# Patient Record
Sex: Female | Born: 1955 | Race: White | Hispanic: No | Smoking: Former smoker
Health system: Southern US, Community
[De-identification: ages and names within clinical notes are randomized; demographics above are authoritative.]

## PROBLEM LIST (undated history)

## (undated) DIAGNOSIS — D649 Anemia, unspecified: Secondary | ICD-10-CM

## (undated) DIAGNOSIS — K635 Polyp of colon: Secondary | ICD-10-CM

## (undated) DIAGNOSIS — D219 Benign neoplasm of connective and other soft tissue, unspecified: Secondary | ICD-10-CM

## (undated) DIAGNOSIS — N83209 Unspecified ovarian cyst, unspecified side: Secondary | ICD-10-CM

## (undated) DIAGNOSIS — K5790 Diverticulosis of intestine, part unspecified, without perforation or abscess without bleeding: Secondary | ICD-10-CM

## (undated) DIAGNOSIS — F419 Anxiety disorder, unspecified: Secondary | ICD-10-CM

## (undated) DIAGNOSIS — E785 Hyperlipidemia, unspecified: Secondary | ICD-10-CM

## (undated) DIAGNOSIS — J40 Bronchitis, not specified as acute or chronic: Secondary | ICD-10-CM

## (undated) DIAGNOSIS — K219 Gastro-esophageal reflux disease without esophagitis: Secondary | ICD-10-CM

## (undated) DIAGNOSIS — G4700 Insomnia, unspecified: Secondary | ICD-10-CM

## (undated) DIAGNOSIS — G479 Sleep disorder, unspecified: Secondary | ICD-10-CM

## (undated) DIAGNOSIS — T7840XA Allergy, unspecified, initial encounter: Secondary | ICD-10-CM

## (undated) HISTORY — DX: Allergy, unspecified, initial encounter: T78.40XA

## (undated) HISTORY — PX: WISDOM TOOTH EXTRACTION: SHX21

## (undated) HISTORY — DX: Anemia, unspecified: D64.9

## (undated) HISTORY — DX: Diverticulosis of intestine, part unspecified, without perforation or abscess without bleeding: K57.90

## (undated) HISTORY — DX: Sleep disorder, unspecified: G47.9

## (undated) HISTORY — PX: UPPER GASTROINTESTINAL ENDOSCOPY: SHX188

## (undated) HISTORY — DX: Unspecified ovarian cyst, unspecified side: N83.209

## (undated) HISTORY — DX: Insomnia, unspecified: G47.00

## (undated) HISTORY — DX: Hyperlipidemia, unspecified: E78.5

## (undated) HISTORY — DX: Benign neoplasm of connective and other soft tissue, unspecified: D21.9

## (undated) HISTORY — DX: Anxiety disorder, unspecified: F41.9

## (undated) HISTORY — DX: Polyp of colon: K63.5

## (undated) HISTORY — DX: Bronchitis, not specified as acute or chronic: J40

## (undated) HISTORY — PX: POLYPECTOMY: SHX149

## (undated) HISTORY — PX: COLONOSCOPY: SHX174

## (undated) HISTORY — DX: Gastro-esophageal reflux disease without esophagitis: K21.9

---
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
## 2016-09-26 ENCOUNTER — Emergency Department (HOSPITAL_COMMUNITY)
Admission: EM | Admit: 2016-09-26 | Discharge: 2016-09-26 | Disposition: A | Discharge status: Home / Self Care | Payer: 59 | Attending: Emergency Medicine | Admitting: Emergency Medicine

## 2016-09-26 ENCOUNTER — Encounter (HOSPITAL_COMMUNITY): Payer: Self-pay | Admitting: Emergency Medicine

## 2016-09-26 DIAGNOSIS — Y9389 Activity, other specified: Secondary | ICD-10-CM | POA: Diagnosis not present

## 2016-09-26 DIAGNOSIS — W268XXA Contact with other sharp object(s), not elsewhere classified, initial encounter: Secondary | ICD-10-CM | POA: Diagnosis not present

## 2016-09-26 DIAGNOSIS — Y999 Unspecified external cause status: Secondary | ICD-10-CM | POA: Insufficient documentation

## 2016-09-26 DIAGNOSIS — Y929 Unspecified place or not applicable: Secondary | ICD-10-CM | POA: Diagnosis not present

## 2016-09-26 DIAGNOSIS — S61211A Laceration without foreign body of left index finger without damage to nail, initial encounter: Secondary | ICD-10-CM | POA: Diagnosis present

## 2016-09-26 MED ORDER — LIDOCAINE HCL 2 % IJ SOLN
10.0000 mL | Freq: Once | INTRAMUSCULAR | Status: AC
  Administered 2016-09-26: 200 mg
  Filled 2016-09-26: qty 20

## 2016-09-26 NOTE — ED Triage Notes (Signed)
Patient was crafting and cut her left index finger.

## 2016-09-26 NOTE — ED Provider Notes (Signed)
[facility] DEPT Provider Note   CSN: [id number] Arrival date & time: 09/26/16  0211     History   Chief Complaint Chief Complaint  Patient presents with  . Finger Injury    HPI Taylor Duffy is a 61 y.o. female with No major medical history presents to the Emergency Department complaining of acute, persistent laceration to the left index finger occurring approximately 30 minutes prior to arrival. Patient reports she was using round cutter to cut fabric when it slipped cutting her finger. She reports her tetanus is up-to-date within the last month. No aggravating or alleviating factors. She reports profuse bleeding initially which is now controlled. Pain is mild.  The history is provided by the patient and medical records. No language interpreter was used.    History reviewed. No pertinent past medical history.  There are no active problems to display for this patient.   History reviewed. No pertinent surgical history.  OB History    No data available       Home Medications    Prior to Admission medications   Not on File    Family History History reviewed. No pertinent family history.  Social History Social History  Substance Use Topics  . Smoking status: Never Smoker  . Smokeless tobacco: Never Used  . Alcohol use No     Allergies   Ivp dye [iodinated diagnostic agents]   Review of Systems Review of Systems  Skin: Positive for wound.  All other systems reviewed and are negative.    Physical Exam Updated Vital Signs BP 121/90 (BP Location: Left Arm)   Pulse 103   Temp 97.7 F (36.5 C) (Oral)   Resp 20   SpO2 99%   Physical Exam  Constitutional: She is oriented to person, place, and time. She appears well-developed and well-nourished. No distress.  HENT:  Head: Normocephalic and atraumatic.  Eyes: Conjunctivae are normal. No scleral icterus.  Neck: Normal range of motion.  Cardiovascular: Normal rate, regular rhythm, normal heart sounds and  intact distal pulses.   No murmur heard. Capillary refill < 3 sec  Pulmonary/Chest: Effort normal and breath sounds normal. No respiratory distress.  Musculoskeletal: Normal range of motion. She exhibits no edema.  ROM: Full range of motion of the left index finger 3 cm laceration to the lateral side over the PIP joint, hemostasis achieved  Neurological: She is alert and oriented to person, place, and time.  Sensation: Intact to dull and sharp throughout the left first finger Strength: Right/5 with flexion extension of the left first finger  Skin: Skin is warm and dry. She is not diaphoretic.  Psychiatric: She has a normal mood and affect.  Nursing note and vitals reviewed.    ED Treatments / Results   Procedures .Marland KitchenLaceration Repair Date/Time: 09/26/2016 3:10 AM Performed by: Abigail Butts Authorized by: Abigail Butts   Consent:    Consent obtained:  Verbal   Consent given by:  Patient   Risks discussed:  Infection, pain, poor cosmetic result, poor wound healing and need for additional repair   Alternatives discussed:  No treatment and observation Anesthesia (see MAR for exact dosages):    Anesthesia method:  Nerve block   Block needle gauge:  25 G   Block anesthetic:  Lidocaine 2% w/o epi (5 mL) Laceration details:    Location:  Finger   Finger location:  L index finger   Length (cm):  3 Repair type:    Repair type:  Simple Pre-procedure details:  Preparation:  Patient was prepped and draped in usual sterile fashion Exploration:    Hemostasis achieved with:  Direct pressure   Wound exploration: wound explored through full range of motion and entire depth of wound probed and visualized     Contaminated: no   Treatment:    Area cleansed with:  Saline   Amount of cleaning:  Standard   Irrigation solution:  Sterile water   Irrigation volume:  243ml   Irrigation method:  Syringe Skin repair:    Repair method:  Sutures   Suture size:  5-0   Suture  material:  Prolene   Suture technique:  Simple interrupted   Number of sutures:  3 Approximation:    Approximation:  Close   Vermilion border: well-aligned   Post-procedure details:    Dressing:  Open (no dressing)   Patient tolerance of procedure:  Tolerated well, no immediate complications    (including critical care time)  Medications Ordered in ED Medications  lidocaine (XYLOCAINE) 2 % (with pres) injection 200 mg (200 mg Infiltration Given 09/26/16 0308)     Initial Impression / Assessment and Plan / ED Course  I have reviewed the triage vital signs and the nursing notes.  Pertinent labs & imaging results that were available during my care of the patient were reviewed by me and considered in my medical decision making (see chart for details).     Pressure irrigation performed. Wound explored and base of wound visualized in a bloodless field without evidence of foreign body.  Laceration occurred < 8 hours prior to repair which was well tolerated.  Tdap up to date.  Pt has no comorbidities to effect normal wound healing. Pt discharged without antibiotics.  Discussed suture home care with patient and answered questions. Pt to follow-up for wound check and suture removal in 7 days; they are to return to the ED sooner for signs of infection. Pt is hemodynamically stable with no complaints prior to dc.   Final Clinical Impressions(s) / ED Diagnoses   Final diagnoses:  Laceration of left index finger without foreign body without damage to nail, initial encounter    New Prescriptions New Prescriptions   No medications on file     Abigail Butts, PA-C [id number]    Delora Fuel, MD [id number]

## 2016-09-26 NOTE — ED Notes (Signed)
ED Provider at bedside for suturing.  

## 2016-09-26 NOTE — Discharge Instructions (Signed)

## 2017-10-18 ENCOUNTER — Telehealth: Payer: Self-pay | Admitting: Gastroenterology

## 2017-10-18 ENCOUNTER — Encounter: Payer: Self-pay | Admitting: Gastroenterology

## 2017-10-18 NOTE — Telephone Encounter (Signed)
A user error has taken place.

## 2017-11-30 ENCOUNTER — Encounter: Payer: Self-pay | Admitting: Gastroenterology

## 2017-11-30 ENCOUNTER — Encounter (INDEPENDENT_AMBULATORY_CARE_PROVIDER_SITE_OTHER): Payer: Self-pay

## 2017-11-30 ENCOUNTER — Ambulatory Visit: Payer: 59 | Admitting: Gastroenterology

## 2017-11-30 VITALS — BP 110/70 | HR 96 | Ht 63.25 in | Wt 155.6 lb

## 2017-11-30 DIAGNOSIS — R1031 Right lower quadrant pain: Secondary | ICD-10-CM

## 2017-11-30 DIAGNOSIS — Z86010 Personal history of colonic polyps: Secondary | ICD-10-CM | POA: Diagnosis not present

## 2017-11-30 DIAGNOSIS — R1011 Right upper quadrant pain: Secondary | ICD-10-CM

## 2017-11-30 DIAGNOSIS — Z800 Family history of malignant neoplasm of digestive organs: Secondary | ICD-10-CM | POA: Diagnosis not present

## 2017-11-30 MED ORDER — NA SULFATE-K SULFATE-MG SULF 17.5-3.13-1.6 GM/177ML PO SOLN: ORAL | 0 refills | Status: DC

## 2017-11-30 MED ORDER — NA SULFATE-K SULFATE-MG SULF 17.5-3.13-1.6 GM/177ML PO SOLN: 1.0000 | Freq: Once | ORAL | 0 refills | Status: AC

## 2017-11-30 NOTE — Progress Notes (Signed)
Taylor Duffy    [id number]    17-Dec-1955  Primary Care Physician:Gagliardi, Anthoney Harada, MD  Referring Physician: No referring provider defined for this encounter.  Chief complaint: Right-sided abdominal pain  HPI: 62 year old female here for new patient visit with complaints of right-sided abdominal pain. He has had right upper quadrant abdominal pain on and off for the past 2 years, feels it is progressively getting worse and in the past 2-3 months she started having constant right lower quadrant abdominal pain She was diagnosed with irritable bowel with diarrhea and has episodes of increased bowel frequency and loose stool associated with abdominal cramps about once a week She has noticed exacerbation of symptoms when she consumes milk products like ice cream Colon cancer in Father in his 65's had surgery to resect Grandfather also had cancer, unsure what and maternal uncle had lung cancer No loss of appetite or weight loss Denies any nausea, vomiting, abdominal pain, melena or bright red blood per rectum  Personal history of colon polyps.  Colonoscopy report not available during this visit  She had EGD, Colonoscopies X 3 in the past patient with removal of small polyps  starting at age 84, most recent exam about 14-15 years per patient.    Outpatient Encounter Medications as of 11/30/2017  Medication Sig  . buPROPion (WELLBUTRIN XL) 300 MG 24 hr tablet Take 300 mg by mouth daily.  . Cholecalciferol 1000 units TBDP Take by mouth.  . DULoxetine (CYMBALTA) 30 MG capsule Take 30 mg by mouth daily.  . Eszopiclone 3 MG TABS Take 3 mg by mouth at bedtime. Take immediately before bedtime  . ezetimibe (ZETIA) 10 MG tablet Take 10 mg by mouth daily.  . fluticasone (FLOVENT HFA) 110 MCG/ACT inhaler Inhale into the lungs 2 (two) times daily.  Marland Kitchen rOPINIRole (REQUIP) 0.25 MG tablet Take 0.25 mg by mouth 3 (three) times daily. Take 3 tablets by mouth daily   No  facility-administered encounter medications on file as of 11/30/2017.     Allergies as of 11/30/2017 - Review Complete 11/30/2017  Allergen Reaction Noted  . Ivp dye [iodinated diagnostic agents]  09/26/2016    Past Medical History:  Diagnosis Date  . Anxiety   . Bronchitis   . Colon polyps   . Diverticulosis   . Fibroid    small fundal   . HLD (hyperlipidemia)   . Ovarian cyst   . Restless sleeper   . Sleep difficulties     Past Surgical History:  Procedure Laterality Date  . CESAREAN SECTION    . NASAL SEPTUM SURGERY  1974  . WISDOM TOOTH EXTRACTION      Family History  Problem Relation Age of Onset  . Colon cancer Father 32  . Heart disease Father   . Heart disease Mother     Social History   Socioeconomic History  . Marital status: Single    Spouse name: Not on file  . Number of children: 1  . Years of education: Not on file  . Highest education level: Not on file  Occupational History  . Occupation: retired  Scientific laboratory technician  . Financial resource strain: Not on file  . Food insecurity:    Worry: Not on file    Inability: Not on file  . Transportation needs:    Medical: Not on file    Non-medical: Not on file  Tobacco Use  . Smoking status: Former Smoker    Last  attempt to quit: 1980    Years since quitting: 39.2  . Smokeless tobacco: Never Used  Substance and Sexual Activity  . Alcohol use: Yes    Comment: 2-3 per day  . Drug use: No  . Sexual activity: Not on file  Lifestyle  . Physical activity:    Days per week: Not on file    Minutes per session: Not on file  . Stress: Not on file  Relationships  . Social connections:    Talks on phone: Not on file    Gets together: Not on file    Attends religious service: Not on file    Active member of club or organization: Not on file    Attends meetings of clubs or organizations: Not on file    Relationship status: Not on file  . Intimate partner violence:    Fear of current or ex partner: Not on  file    Emotionally abused: Not on file    Physically abused: Not on file    Forced sexual activity: Not on file  Other Topics Concern  . Not on file  Social History Narrative  . Not on file      Review of systems: Review of Systems  Constitutional: Negative for fever and chills. Positive for fatigue HENT: Negative.   Eyes: Negative for blurred vision.  Respiratory: Negative for cough, shortness of breath and wheezing.  Postive for cough Cardiovascular: Negative for chest pain and palpitations.  Gastrointestinal: as per HPI Genitourinary: Negative for dysuria, urgency, frequency and hematuria.  Musculoskeletal: Negative for myalgias, back pain and joint pain.  Skin: Negative for itching and rash.  Neurological: Negative for dizziness, tremors, focal weakness, seizures and loss of consciousness.  Endo/Heme/Allergies: Positive for seasonal allergies.  Psychiatric/Behavioral: Negative for depression, suicidal ideas and hallucinations.  All other systems reviewed and are negative.   Physical Exam: Vitals:   11/30/17 0856  BP: 110/70  Pulse: 96   Body mass index is 27.35 kg/m. Gen:      No acute distress HEENT:  EOMI, sclera anicteric Neck:     No masses; no thyromegaly Lungs:    Clear to auscultation bilaterally; normal respiratory effort CV:         Regular rate and rhythm; no murmurs Abd:      + bowel sounds; soft, non-tender; no palpable masses, no distension Ext:    No edema; adequate peripheral perfusion Skin:      Warm and dry; no rash Neuro: alert and oriented x 3 Psych: normal mood and affect  Data Reviewed:  Reviewed labs, radiology imaging, old records and pertinent past GI work up  Pelvic Ultrasound 09/29/16 Small left ovarian cyst and small fundal fibroid  XR chest  Persistent lower lobe bronchial wall thickening, mild bronchiectasis vs bronchitis vs partial atelectasis  B12 level: 154  Assessment and Plan/Recommendations: 62 year old female here  with complaints of right upper quadrant abdominal pain and constant right lower quadrant abdominal pain.  She has family history of colon cancer and also personal history of colon polyps We will schedule for abdominal ultrasound to exclude gallbladder disease Patient is past due for screening colonoscopy, will proceed with it Will obtain records of her prior endoscopy and colonoscopy from previous GI The risks and benefits as well as alternatives of endoscopic procedure(s) have been discussed and reviewed. All questions answered. The patient agrees to proceed.  Greater than 50% of the time used for counseling as well as treatment plan and follow-up. She had  multiple questions which were answered to her satisfaction  K. Denzil Magnuson , MD [phone]    CC: No ref. provider found

## 2017-11-30 NOTE — Patient Instructions (Signed)
You have been scheduled for an abdominal ultrasound at [facility] (1st floor of hospital) on 12/03/2017 at 10:30am. Please arrive 15 minutes prior to your appointment for registration. Make certain not to have anything to eat or drink 6 hours prior to your appointment. Should you need to reschedule your appointment, please contact radiology at [phone]. This test typically takes about 30 minutes to perform.  You have been scheduled for a colonoscopy. Please follow written instructions given to you at your visit today.  Please pick up your prep supplies at the pharmacy within the next 1-3 days. If you use inhalers (even only as needed), please bring them with you on the day of your procedure.

## 2017-12-01 ENCOUNTER — Encounter: Payer: Self-pay | Admitting: Gastroenterology

## 2017-12-03 ENCOUNTER — Ambulatory Visit (HOSPITAL_COMMUNITY)
Admission: RE | Admit: 2017-12-03 | Discharge: 2017-12-03 | Disposition: A | Discharge status: Home / Self Care | Payer: 59 | Source: Ambulatory Visit | Attending: Gastroenterology | Admitting: Gastroenterology

## 2017-12-03 ENCOUNTER — Encounter: Payer: Self-pay | Admitting: Gastroenterology

## 2017-12-03 DIAGNOSIS — Z800 Family history of malignant neoplasm of digestive organs: Secondary | ICD-10-CM | POA: Diagnosis not present

## 2017-12-03 DIAGNOSIS — R1031 Right lower quadrant pain: Secondary | ICD-10-CM | POA: Diagnosis not present

## 2017-12-03 DIAGNOSIS — Z86010 Personal history of colonic polyps: Secondary | ICD-10-CM | POA: Insufficient documentation

## 2017-12-03 DIAGNOSIS — R932 Abnormal findings on diagnostic imaging of liver and biliary tract: Secondary | ICD-10-CM | POA: Diagnosis not present

## 2017-12-03 DIAGNOSIS — R160 Hepatomegaly, not elsewhere classified: Secondary | ICD-10-CM | POA: Insufficient documentation

## 2017-12-03 DIAGNOSIS — R1011 Right upper quadrant pain: Secondary | ICD-10-CM | POA: Insufficient documentation

## 2017-12-06 ENCOUNTER — Other Ambulatory Visit: Payer: Self-pay

## 2017-12-06 ENCOUNTER — Telehealth: Payer: Self-pay | Admitting: Gastroenterology

## 2017-12-06 DIAGNOSIS — K760 Fatty (change of) liver, not elsewhere classified: Secondary | ICD-10-CM

## 2017-12-06 DIAGNOSIS — R933 Abnormal findings on diagnostic imaging of other parts of digestive tract: Secondary | ICD-10-CM

## 2017-12-06 NOTE — Telephone Encounter (Signed)
Patient is advised. Agrees to MRI of the liver. See imaging report.

## 2017-12-06 NOTE — Telephone Encounter (Signed)
Pt returned call regarding [place] results.

## 2017-12-07 ENCOUNTER — Encounter: Payer: 59 | Admitting: Gastroenterology

## 2017-12-09 ENCOUNTER — Ambulatory Visit (AMBULATORY_SURGERY_CENTER): Payer: 59 | Admitting: Gastroenterology

## 2017-12-09 ENCOUNTER — Other Ambulatory Visit: Payer: Self-pay

## 2017-12-09 ENCOUNTER — Encounter: Payer: Self-pay | Admitting: Gastroenterology

## 2017-12-09 VITALS — BP 130/73 | HR 82 | Temp 98.0°F | Resp 19 | Ht 63.25 in | Wt 155.0 lb

## 2017-12-09 DIAGNOSIS — K635 Polyp of colon: Secondary | ICD-10-CM | POA: Diagnosis not present

## 2017-12-09 DIAGNOSIS — R1011 Right upper quadrant pain: Secondary | ICD-10-CM | POA: Diagnosis present

## 2017-12-09 DIAGNOSIS — D123 Benign neoplasm of transverse colon: Secondary | ICD-10-CM

## 2017-12-09 DIAGNOSIS — Z800 Family history of malignant neoplasm of digestive organs: Secondary | ICD-10-CM

## 2017-12-09 DIAGNOSIS — D125 Benign neoplasm of sigmoid colon: Secondary | ICD-10-CM

## 2017-12-09 MED ORDER — SODIUM CHLORIDE 0.9 % IV SOLN: 500.0000 mL | Freq: Once | INTRAVENOUS | Status: AC

## 2017-12-09 NOTE — Op Note (Signed)
[facility] Patient Name: Taylor Duffy Procedure Date: 12/09/2017 9:56 AM MRN: [id number] Endoscopist: Mauri Pole , MD Age: 62 Referring MD:  Date of Birth: 05/29/1956 Gender: Female Account #: [id number] Procedure:                Colonoscopy Indications:              Last colonoscopy 10 years ago, Abdominal pain in                            the right lower quadrant, Family history of colon                            cancer in a first-degree relative Medicines:                Monitored Anesthesia Care Procedure:                Pre-Anesthesia Assessment:                           - Prior to the procedure, a History and Physical                            was performed, and patient medications and                            allergies were reviewed. The patient's tolerance of                            previous anesthesia was also reviewed. The risks                            and benefits of the procedure and the sedation                            options and risks were discussed with the patient.                            All questions were answered, and informed consent                            was obtained. Prior Anticoagulants: The patient has                            taken no previous anticoagulant or antiplatelet                            agents. ASA Grade Assessment: II - A patient with                            mild systemic disease. After reviewing the risks                            and benefits, the patient was deemed in  satisfactory condition to undergo the procedure.                           After obtaining informed consent, the colonoscope                            was passed under direct vision. Throughout the                            procedure, the patient's blood pressure, pulse, and                            oxygen saturations were monitored continuously. The                            Model PCF-H190DL  [id number]) scope was introduced                            through the anus and advanced to the the terminal                            ileum, with identification of the appendiceal                            orifice and IC valve. The colonoscopy was performed                            without difficulty. The patient tolerated the                            procedure well. The quality of the bowel                            preparation was excellent. The terminal ileum,                            ileocecal valve, appendiceal orifice, and rectum                            were photographed. Scope In: 10:07:00 AM Scope Out: 10:22:09 AM Scope Withdrawal Time: 0 hours 8 minutes 57 seconds  Total Procedure Duration: 0 hours 15 minutes 9 seconds  Findings:                 The perianal and digital rectal examinations were                            normal.                           A 5 mm polyp was found in the transverse colon. The                            polyp was sessile. The polyp was removed with a  cold snare. Resection and retrieval were complete.                           A 1 mm polyp was found in the sigmoid colon. The                            polyp was sessile. The polyp was removed with a                            cold biopsy forceps. Resection and retrieval were                            complete.                           Non-bleeding internal hemorrhoids were found during                            retroflexion. The hemorrhoids were small. Complications:            No immediate complications. Estimated Blood Loss:     Estimated blood loss was minimal. Impression:               - One 5 mm polyp in the transverse colon, removed                            with a cold snare. Resected and retrieved.                           - One 1 mm polyp in the sigmoid colon, removed with                            a cold biopsy forceps. Resected and retrieved.                            - Non-bleeding internal hemorrhoids. Recommendation:           - Patient has a contact number available for                            emergencies. The signs and symptoms of potential                            delayed complications were discussed with the                            patient. Return to normal activities tomorrow.                            Written discharge instructions were provided to the                            patient.                           -  Resume previous diet.                           - Continue present medications.                           - Await pathology results.                           - Repeat colonoscopy in 5 years for surveillance                            based on pathology results. Mauri Pole, MD 12/09/2017 10:31:20 AM This report has been signed electronically.

## 2017-12-09 NOTE — Progress Notes (Signed)
Called to room to assist during endoscopic procedure.  Patient ID and intended procedure confirmed with present staff. Received instructions for my participation in the procedure from the performing physician.  

## 2017-12-09 NOTE — Progress Notes (Signed)
A and O x3. Report to RN. Tolerated MAC anesthesia well.

## 2017-12-09 NOTE — Patient Instructions (Signed)
INFORMATION ON POLYPS GIVEN.   YOU HAD AN ENDOSCOPIC PROCEDURE TODAY AT THE [facility]:   Refer to the procedure report that was given to you for any specific questions about what was found during the examination.  If the procedure report does not answer your questions, please call your gastroenterologist to clarify.  If you requested that your care partner not be given the details of your procedure findings, then the procedure report has been included in a sealed envelope for you to review at your convenience later.  YOU SHOULD EXPECT: Some feelings of bloating in the abdomen. Passage of more gas than usual.  Walking can help get rid of the air that was put into your GI tract during the procedure and reduce the bloating. If you had a lower endoscopy (such as a colonoscopy or flexible sigmoidoscopy) you may notice spotting of blood in your stool or on the toilet paper. If you underwent a bowel prep for your procedure, you may not have a normal bowel movement for a few days.  Please Note:  You might notice some irritation and congestion in your nose or some drainage.  This is from the oxygen used during your procedure.  There is no need for concern and it should clear up in a day or so.  SYMPTOMS TO REPORT IMMEDIATELY:   Following lower endoscopy (colonoscopy or flexible sigmoidoscopy):  Excessive amounts of blood in the stool  Significant tenderness or worsening of abdominal pains  Swelling of the abdomen that is new, acute  Fever of 100F or higher    For urgent or emergent issues, a gastroenterologist can be reached at any hour by calling [phone].   DIET:  We do recommend a small meal at first, but then you may proceed to your regular diet.  Drink plenty of fluids but you should avoid alcoholic beverages for 24 hours.  ACTIVITY:  You should plan to take it easy for the rest of today and you should NOT DRIVE or use heavy machinery until tomorrow (because of the sedation  medicines used during the test).    FOLLOW UP: Our staff will call the number listed on your records the next business day following your procedure to check on you and address any questions or concerns that you may have regarding the information given to you following your procedure. If we do not reach you, we will leave a message.  However, if you are feeling well and you are not experiencing any problems, there is no need to return our call.  We will assume that you have returned to your regular daily activities without incident.  If any biopsies were taken you will be contacted by phone or by letter within the next 1-3 weeks.  Please call us at [phone] if you have not heard about the biopsies in 3 weeks.    SIGNATURES/CONFIDENTIALITY: You and/or your care partner have signed paperwork which will be entered into your electronic medical record.  These signatures attest to the fact that that the information above on your After Visit Summary has been reviewed and is understood.  Full responsibility of the confidentiality of this discharge information lies with you and/or your care-partner. 

## 2017-12-13 ENCOUNTER — Telehealth: Payer: Self-pay

## 2017-12-13 NOTE — Telephone Encounter (Signed)
  Follow up Call-  Call back number 12/09/2017  Post procedure Call Back phone  # #[phone] cell  Permission to leave phone message Yes     Patient questions:  Do you have a fever, pain , or abdominal swelling? No. Pain Score  0 *  Have you tolerated food without any problems? Yes.    Have you been able to return to your normal activities? Yes.    Do you have any questions about your discharge instructions: Diet   No. Medications  No. Follow up visit  No.  Do you have questions or concerns about your Care? No.  Actions: * If pain score is 4 or above: No action needed, pain <4.  No problems noted per pt. maw

## 2017-12-18 ENCOUNTER — Ambulatory Visit
Admission: RE | Admit: 2017-12-18 | Discharge: 2017-12-18 | Disposition: A | Discharge status: Home / Self Care | Payer: 59 | Source: Ambulatory Visit | Attending: Gastroenterology | Admitting: Gastroenterology

## 2017-12-18 DIAGNOSIS — R933 Abnormal findings on diagnostic imaging of other parts of digestive tract: Secondary | ICD-10-CM

## 2017-12-18 DIAGNOSIS — K760 Fatty (change of) liver, not elsewhere classified: Secondary | ICD-10-CM

## 2017-12-18 MED ORDER — GADOBENATE DIMEGLUMINE 529 MG/ML IV SOLN
14.0000 mL | Freq: Once | INTRAVENOUS | Status: AC | PRN
  Administered 2017-12-18: 14 mL via INTRAVENOUS

## 2017-12-24 ENCOUNTER — Encounter: Payer: Self-pay | Admitting: Gastroenterology

## 2018-07-01 IMAGING — MR MR ABDOMEN WO/W CM
17 series · 48 of 48 positions shown · IV contrast (14ml multihance)
Comparison: Ultrasound 12/03/2017

CLINICAL DATA: Evaluate liver lesions seen on recent ultrasound.

EXAM:
MRI ABDOMEN WITHOUT AND WITH CONTRAST
TECHNIQUE: Multiplanar multisequence MR imaging of the abdomen was performed
both before and after the administration of intravenous contrast.
CONTRAST:  14mL MULTIHANCE GADOBENATE DIMEGLUMINE 529 MG/ML IV SOLN

[Series 3: T2 · coronal · 5.0mm · 1.56mm/px · 1 of 36 slices shown (1 of 3)]
[im 1/36]
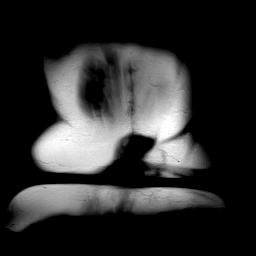

[Series 4: T1 · axial · 3.0mm · 1.19mm/px · z∈[-60,+177]mm · 6 of 160 slices shown]
[im 1/160]
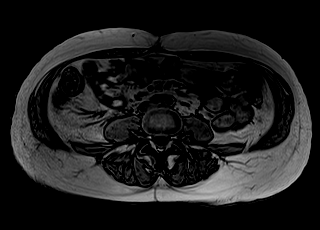
[im 32/160]
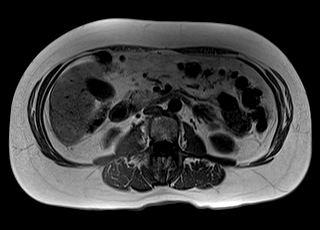
[im 64/160]
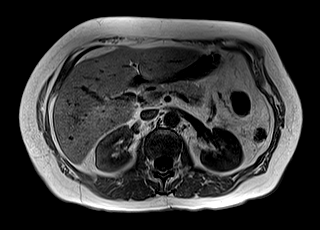
[im 96/160]
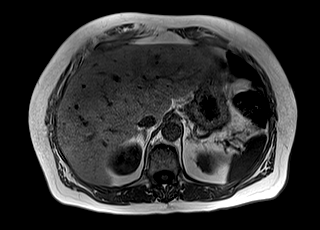
[im 128/160]
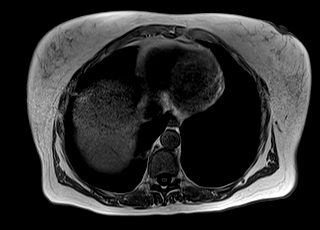
[im 160/160]
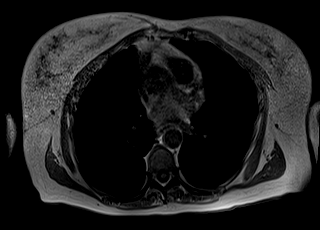

[Series 6: DWI · axial · 5.0mm · 1.38mm/px · z∈[-53,+181]mm · 5 of 120 slices shown (1 of 2)]
[im 1/120]
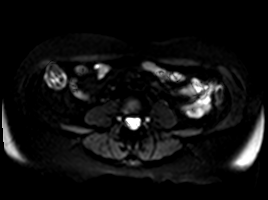
[im 30/120]
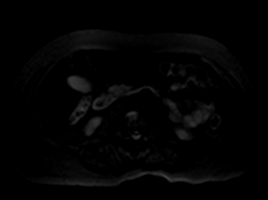
[im 60/120]
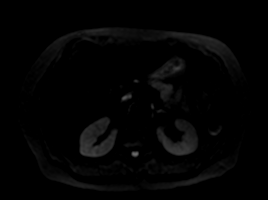
[im 90/120]
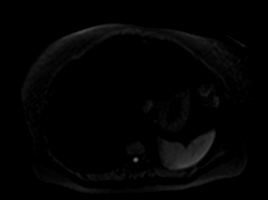
[im 120/120]
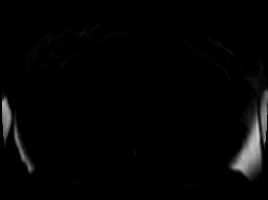

[Series 7: DWI · axial · 5.0mm · 1.38mm/px · z∈[-53,+181]mm · 2 of 40 slices shown (2 of 2)]
[im 1/40]
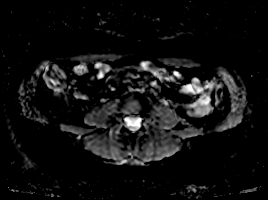
[im 40/40]
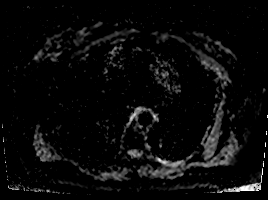

[Series 8: T2 · axial · 5.0mm · 1.48mm/px · z∈[-56,+184]mm · 2 of 41 slices shown (2 of 3)]
[im 1/41]
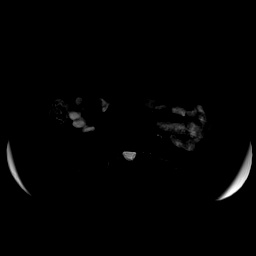
[im 41/41]
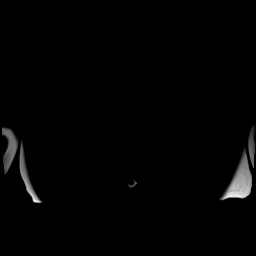

[Series 9: T2 · axial · 6.0mm · 1.16mm/px · 1 of 31 slices shown (3 of 3)]
[im 1/31]
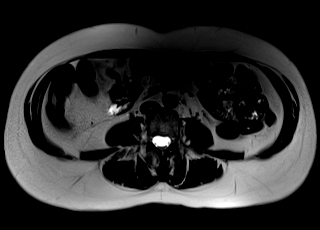

[Series 10: bSSFP · axial · 5.5mm · 1.18mm/px · 1 of 33 slices shown]
[im 1/33]
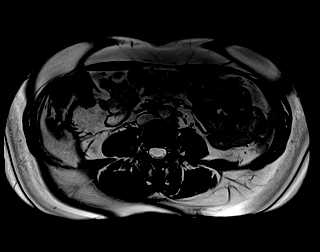

[Series 11: T1 dynamic · axial · non-contrast · 3.0mm · 1.25mm/px · z∈[-60,+177]mm · 3 of 80 slices shown]
[im 1/80]
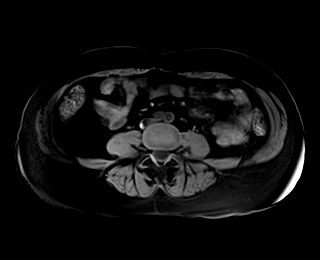
[im 40/80]
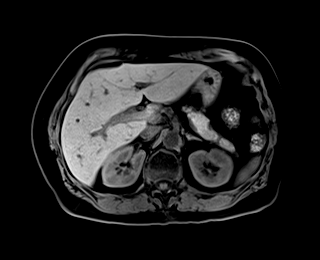
[im 80/80]
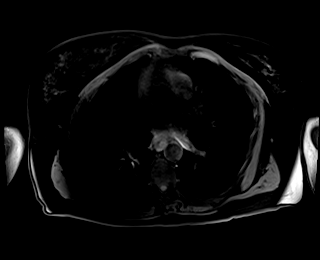

[Series 12: T1 dynamic post-contrast · axial · 3.0mm · 1.25mm/px · z∈[-60,+177]mm · 3 of 80 slices shown (1 of 9)]
[im 1/80]
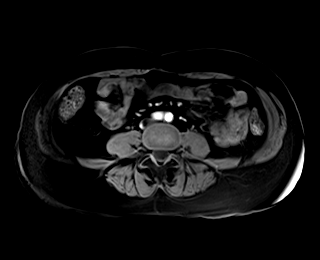
[im 40/80]
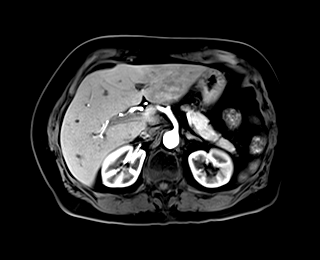
[im 80/80]
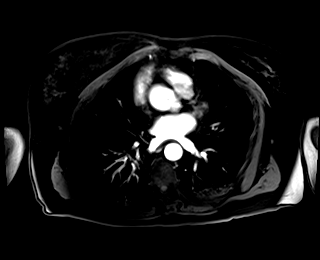

[Series 13: T1 dynamic post-contrast · axial · 3.0mm · 1.25mm/px · z∈[-60,+177]mm · 3 of 80 slices shown (2 of 9)]
[im 1/80]
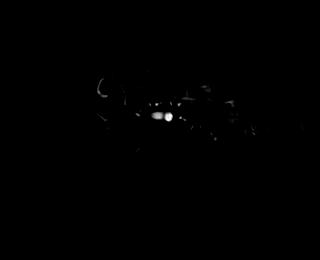
[im 40/80]
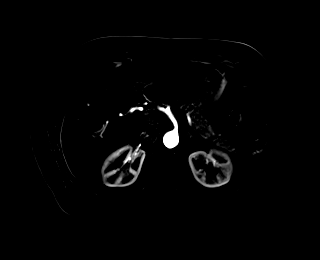
[im 80/80]
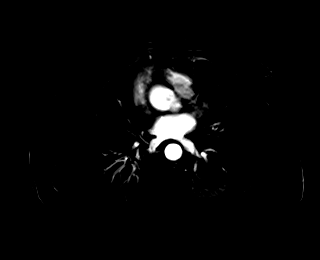

[Series 14: T1 dynamic post-contrast · axial · 3.0mm · 1.25mm/px · z∈[-60,+177]mm · 3 of 80 slices shown (3 of 9)]
[im 1/80]
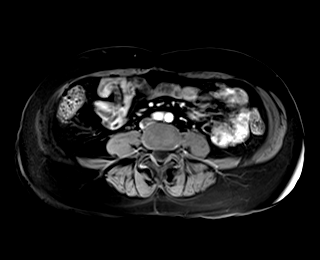
[im 40/80]
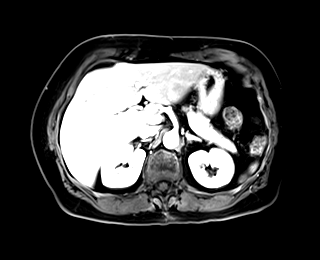
[im 80/80]
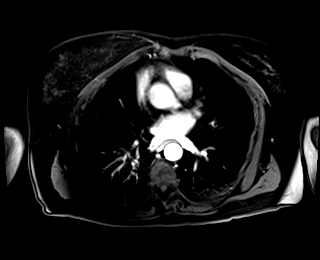

[Series 15: T1 dynamic post-contrast · axial · 3.0mm · 1.25mm/px · z∈[-60,+177]mm · 3 of 80 slices shown (4 of 9)]
[im 1/80]
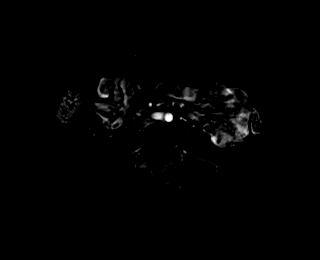
[im 40/80]
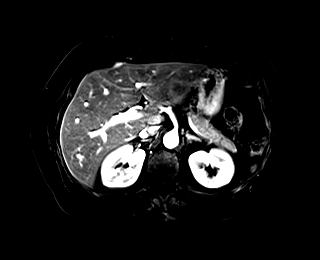
[im 80/80]
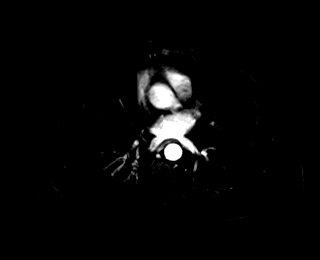

[Series 16: T1 dynamic post-contrast · axial · 3.0mm · 1.25mm/px · z∈[-60,+177]mm · 3 of 80 slices shown (5 of 9)]
[im 1/80]
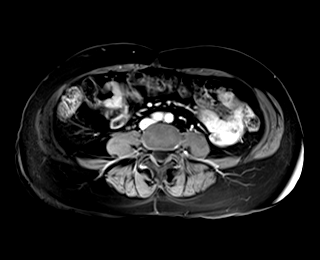
[im 40/80]
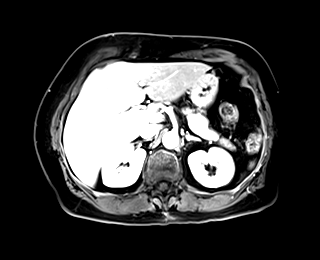
[im 80/80]
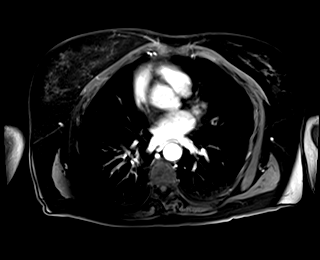

[Series 17: T1 dynamic post-contrast · axial · 3.0mm · 1.25mm/px · z∈[-60,+177]mm · 3 of 80 slices shown (6 of 9)]
[im 1/80]
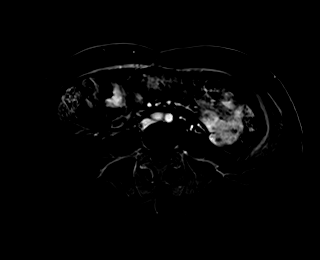
[im 40/80]
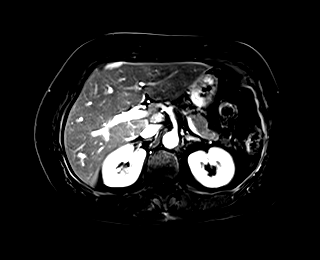
[im 80/80]
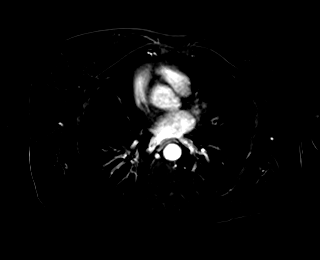

[Series 18: T1 dynamic post-contrast · coronal · 3.0mm · 1.25mm/px · 3 of 72 slices shown (7 of 9)]
[im 1/72]
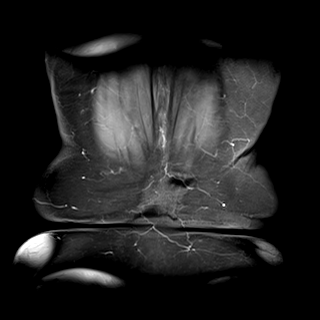
[im 36/72]
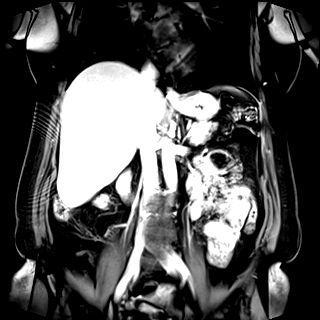
[im 72/72]
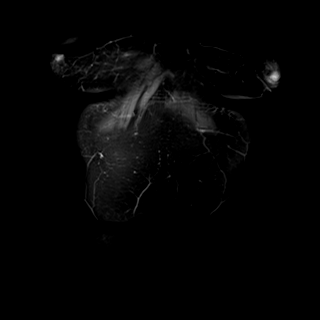

[Series 19: T1 dynamic post-contrast · axial · 3.0mm · 1.25mm/px · z∈[-60,+177]mm · 3 of 80 slices shown (8 of 9)]
[im 1/80]
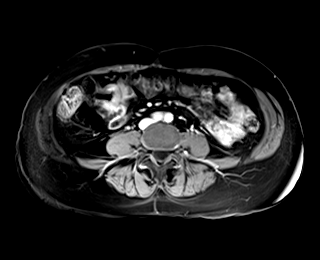
[im 40/80]
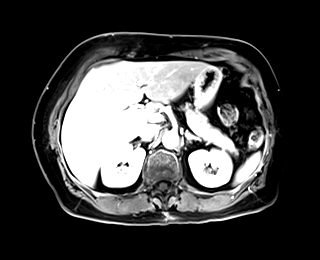
[im 80/80]
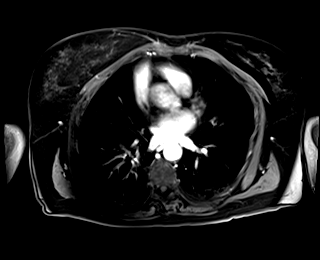

[Series 20: T1 dynamic post-contrast · axial · 3.0mm · 1.25mm/px · z∈[-60,+177]mm · 3 of 80 slices shown (9 of 9)]
[im 1/80]
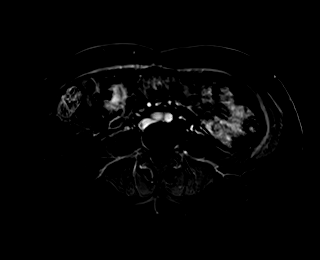
[im 40/80]
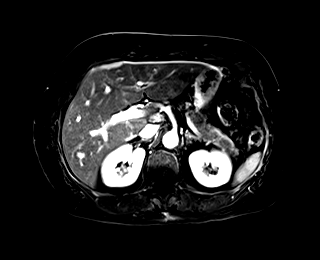
[im 80/80]
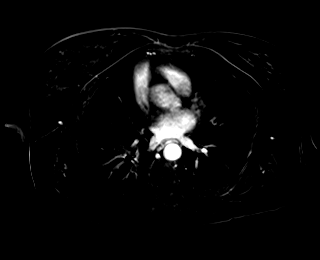

[48 of 48 positions shown; findings below may reference images not displayed]

FINDINGS: Lower chest: The lung bases are clear of acute process. No worrisome
pulmonary lesions. No pleural or pericardial effusion.

Hepatobiliary: Geographic fatty infiltration of the liver is
demonstrated on the gradient echo out of phase T1 weighted sequence.
No hepatic mass lesions. Simple appearing right hepatic lobe cyst.
The gallbladder is normal. No intra or extrahepatic biliary
dilatation.

Pancreas:  No mass, inflammation or ductal dilatation.

Spleen:  Normal size.  No focal lesions.

Adrenals/Urinary Tract: The adrenal glands and kidneys are
unremarkable. Small bilateral renal cysts are noted. No worrisome
renal lesions or hydronephrosis.

Stomach/Bowel: The stomach, duodenum, visualized small bowel and
visualized colon are unremarkable.

Vascular/Lymphatic: The aorta and branch vessels are normal. The
major venous structures are patent. Normal splenic and portal veins
and normal hepatic veins.

No mesenteric or retroperitoneal mass or adenopathy. Small scattered
lymph nodes are noted.

Other:  No ascites or abdominal wall hernia.

Musculoskeletal: No significant bony findings.
IMPRESSION: 1. Geographic fatty infiltration of the liver but no worrisome
hepatic lesions or biliary dilatation.
2. No acute abdominal findings, mass lesions or adenopathy.
3. Simple right hepatic lobe cyst and simple renal cysts.

## 2023-09-19 ENCOUNTER — Encounter: Payer: Self-pay | Admitting: Gastroenterology
